# Patient Record
Sex: Male | Born: 1935 | Race: White | Hispanic: No | Marital: Married | Smoking: Never smoker
Health system: Southern US, Community
[De-identification: ages and names within clinical notes are randomized; demographics above are authoritative.]

## PROBLEM LIST (undated history)

## (undated) ENCOUNTER — Emergency Department (HOSPITAL_COMMUNITY): Payer: Medicare Other

## (undated) DIAGNOSIS — I10 Essential (primary) hypertension: Secondary | ICD-10-CM

## (undated) DIAGNOSIS — E785 Hyperlipidemia, unspecified: Secondary | ICD-10-CM

## (undated) HISTORY — DX: Hyperlipidemia, unspecified: E78.5

## (undated) HISTORY — PX: ANKLE SURGERY: SHX546

## (undated) HISTORY — PX: CARPAL TUNNEL RELEASE: SHX101

---

## 2007-07-10 ENCOUNTER — Other Ambulatory Visit: Payer: Self-pay

## 2007-07-10 ENCOUNTER — Emergency Department: Payer: Self-pay | Admitting: Emergency Medicine

## 2007-07-11 ENCOUNTER — Emergency Department: Payer: Self-pay | Admitting: Internal Medicine

## 2007-07-16 ENCOUNTER — Inpatient Hospital Stay: Payer: Self-pay | Admitting: General Surgery

## 2015-12-08 ENCOUNTER — Other Ambulatory Visit: Payer: Self-pay | Admitting: Family Medicine

## 2015-12-08 DIAGNOSIS — M25561 Pain in right knee: Secondary | ICD-10-CM

## 2015-12-21 ENCOUNTER — Ambulatory Visit
Admission: RE | Admit: 2015-12-21 | Discharge: 2015-12-21 | Disposition: A | Discharge status: Home / Self Care | Payer: Medicare Other | Source: Ambulatory Visit | Attending: Family Medicine | Admitting: Family Medicine

## 2015-12-21 ENCOUNTER — Encounter (INDEPENDENT_AMBULATORY_CARE_PROVIDER_SITE_OTHER): Payer: Self-pay

## 2015-12-21 DIAGNOSIS — X58XXXA Exposure to other specified factors, initial encounter: Secondary | ICD-10-CM | POA: Insufficient documentation

## 2015-12-21 DIAGNOSIS — S86811A Strain of other muscle(s) and tendon(s) at lower leg level, right leg, initial encounter: Secondary | ICD-10-CM | POA: Diagnosis not present

## 2015-12-21 DIAGNOSIS — S83521A Sprain of posterior cruciate ligament of right knee, initial encounter: Secondary | ICD-10-CM | POA: Insufficient documentation

## 2015-12-21 DIAGNOSIS — M7121 Synovial cyst of popliteal space [Baker], right knee: Secondary | ICD-10-CM | POA: Insufficient documentation

## 2015-12-21 DIAGNOSIS — M25561 Pain in right knee: Secondary | ICD-10-CM

## 2015-12-21 DIAGNOSIS — M1711 Unilateral primary osteoarthritis, right knee: Secondary | ICD-10-CM | POA: Diagnosis not present

## 2015-12-21 DIAGNOSIS — M25461 Effusion, right knee: Secondary | ICD-10-CM | POA: Insufficient documentation

## 2015-12-21 DIAGNOSIS — S83281A Other tear of lateral meniscus, current injury, right knee, initial encounter: Secondary | ICD-10-CM | POA: Diagnosis not present

## 2015-12-21 DIAGNOSIS — M659 Synovitis and tenosynovitis, unspecified: Secondary | ICD-10-CM | POA: Insufficient documentation

## 2015-12-21 DIAGNOSIS — S83241A Other tear of medial meniscus, current injury, right knee, initial encounter: Secondary | ICD-10-CM | POA: Insufficient documentation

## 2017-01-26 IMAGING — MR MR KNEE*R* W/O CM
6 series · 36 of 40 positions shown · non-contrast
Comparison: Report from radiographs dated 11/23/2015

CLINICAL DATA: Twisting injury of the right knee 5 weeks ago with
swelling and pain medially. Prior knee surgery remotely.

EXAM:
MRI OF THE RIGHT KNEE WITHOUT CONTRAST
TECHNIQUE: Multiplanar, multisequence MR imaging of the knee was performed. No
intravenous contrast was administered.

[Series 3: PD fat-sat · axial · 3.0mm · 0.33mm/px · z∈[-86,+27]mm · 8 of 35 slices shown (1 of 4)]
[im 1/35]
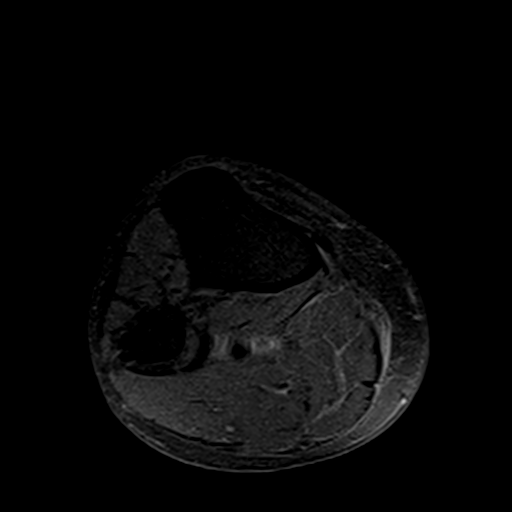
[im 5/35]
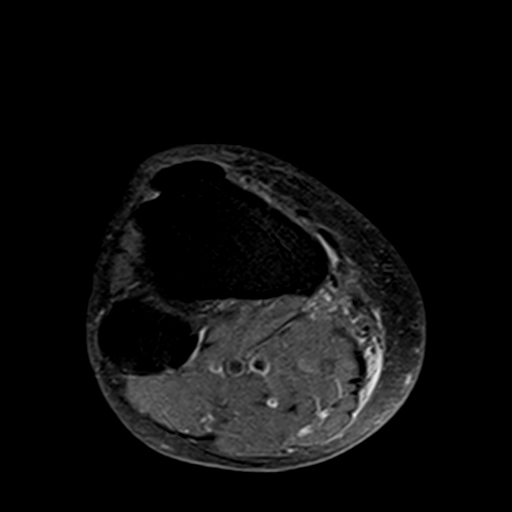
[im 10/35]
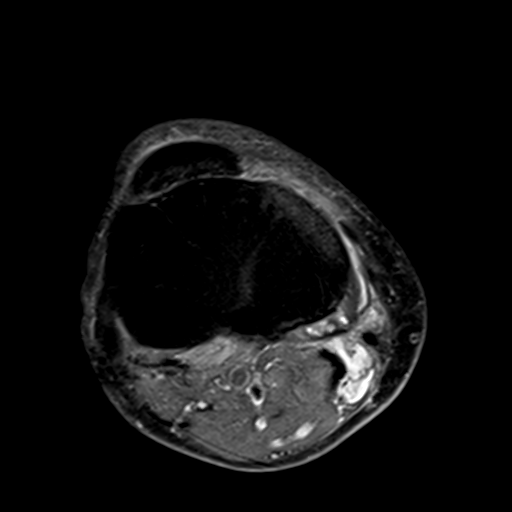
[im 15/35]
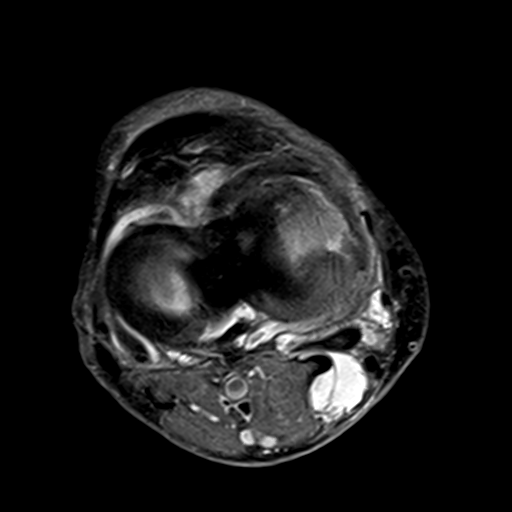
[im 20/35]
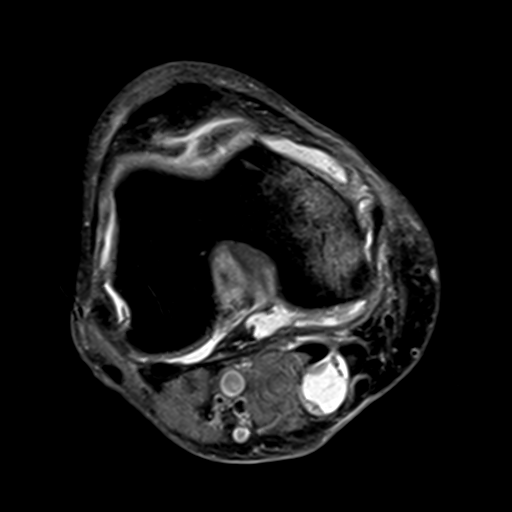
[im 25/35]
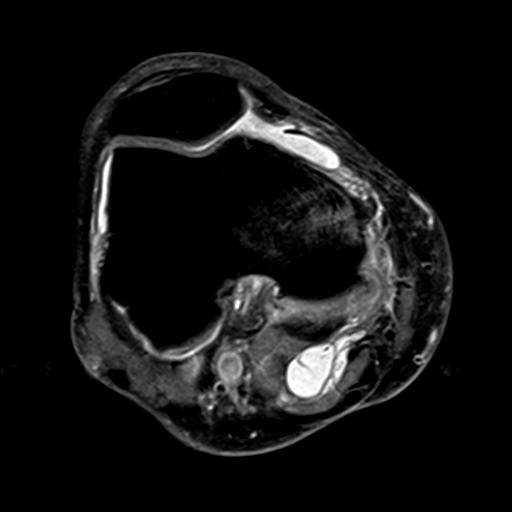
[im 30/35]
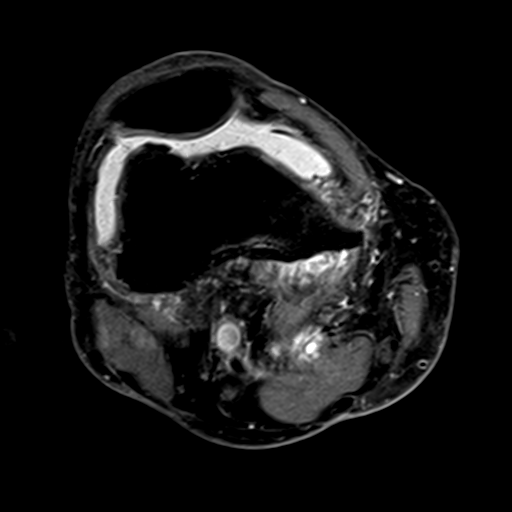
[im 35/35]
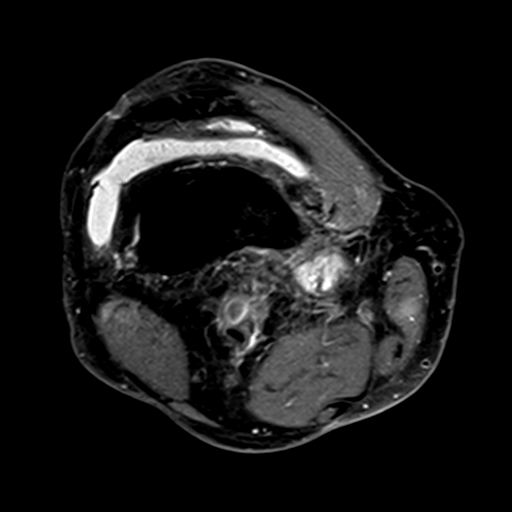

[Series 4: T1 · coronal · 3.0mm · 0.50mm/px · 3 of 33 slices shown]
[im 1/33]
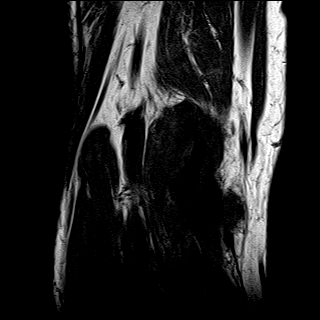
[im 6/33]
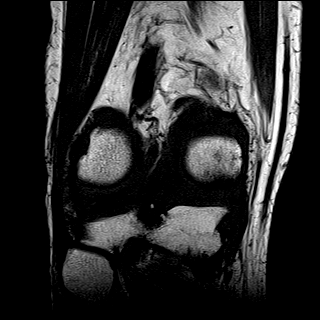
[im 11/33]
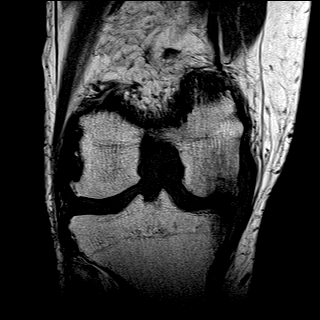

[Series 5: T2 fat-sat · coronal · 3.0mm · 0.50mm/px · 7 of 33 slices shown]
[im 1/33]
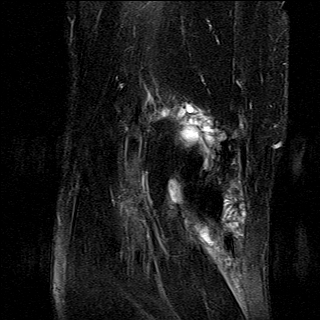
[im 6/33]
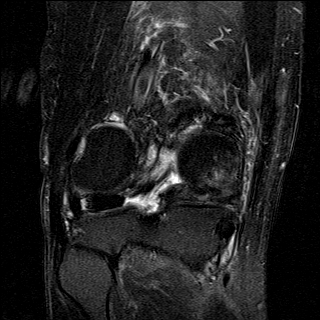
[im 11/33]
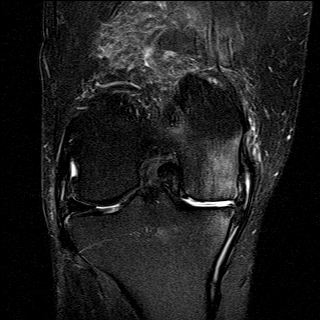
[im 17/33]
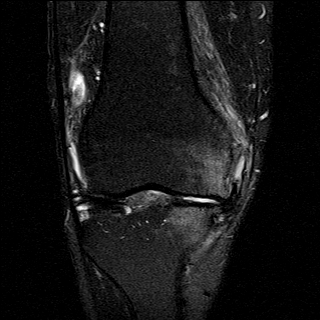
[im 22/33]
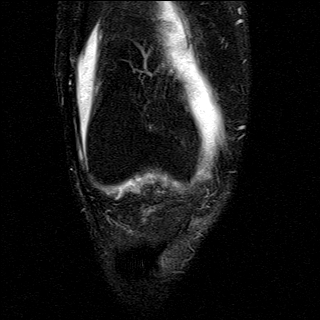
[im 27/33]
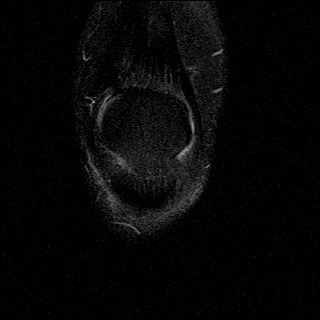
[im 33/33]
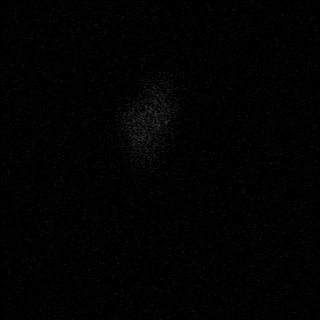

[Series 6: PD fat-sat · coronal · 3.0mm · 0.62mm/px · 7 of 33 slices shown (2 of 4)]
[im 1/33]
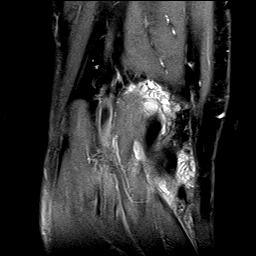
[im 6/33]
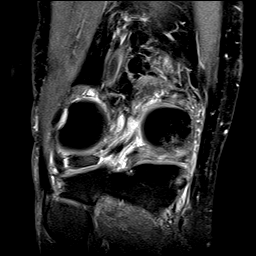
[im 11/33]
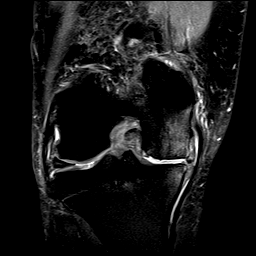
[im 17/33]
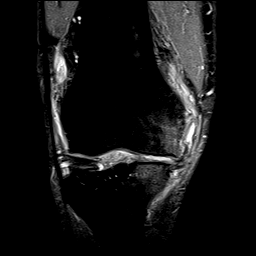
[im 22/33]
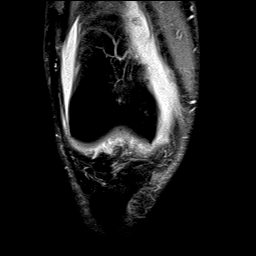
[im 27/33]
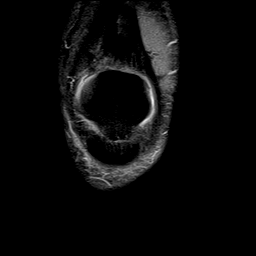
[im 33/33]
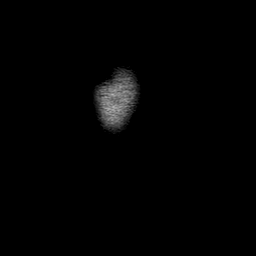

[Series 7: PD fat-sat · sagittal · 3.0mm · 0.62mm/px · 8 of 36 slices shown (3 of 4)]
[im 1/36]
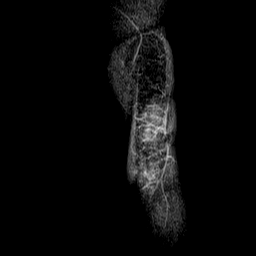
[im 6/36]
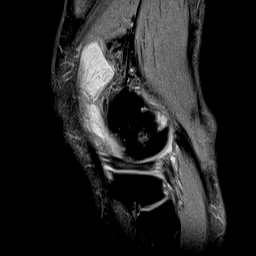
[im 11/36]
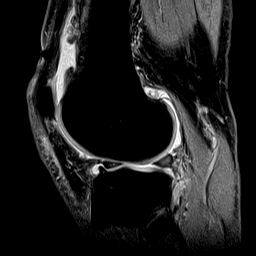
[im 16/36]
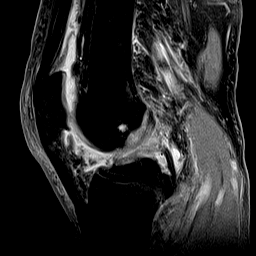
[im 21/36]
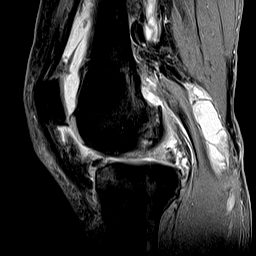
[im 26/36]
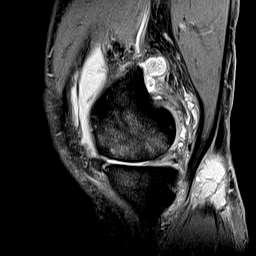
[im 31/36]
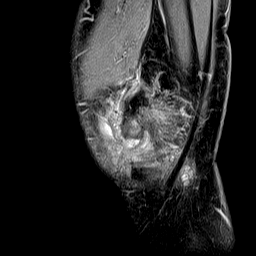
[im 36/36]
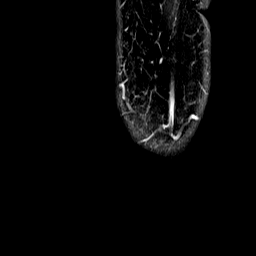

[Series 8: PD fat-sat · oblique · 2.0mm · 0.62mm/px · 3 of 13 slices shown (4 of 4)]
[im 1/13]
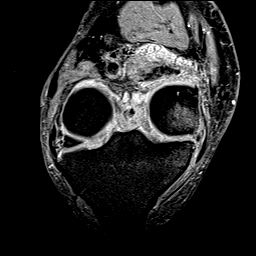
[im 7/13]
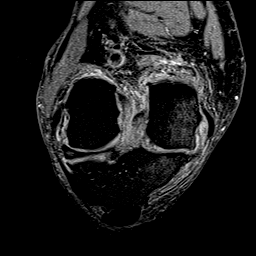
[im 13/13]
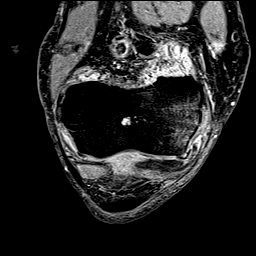

[36 of 40 positions shown; findings below may reference images not displayed]

FINDINGS: MENISCI

Medial meniscus: Severe degenerative tearing of the posterior horn
with horizontal grade 3 signal extending to the free edge and
periphery, prominent abnormal signal throughout the meniscus, and
considerable free edge irregularity. Severely diminutive midbody of
the medial meniscus, possibly from flap tear or partial
meniscectomy. Adjacent tissue in the medial gutter both proximally
and distally could represent meniscal flap.

Lateral meniscus: Large radial tear of the root of the posterior
horn.

LIGAMENTS

Cruciates: Expanded ACL with increased internal signal compatible
with sprain or ACL cyst. I doubt that the ACL is totally torn given
the reasonably normal slope and lack of total fiber discontinuity.
Partially torn PCL.

Collaterals: Partially torn proximal popliteus tendon. Thickened
proximal MCL with surrounding edema suggesting partial tearing.

CARTILAGE

Patellofemoral: Mild to moderate degenerative chondral thinning.
Marginal patellar spurring.

Medial: Markedly severe full-thickness articular cartilage loss in
the majority of the medial compartment with considerable subcortical
marrow edema.

Lateral: Generally mild degenerative chondral thinning with chondral
fissuring and a 5 mm region of chondral delamination in the lateral
tibial plateau as on images 9 through 11 series 5. Marginal
spurring.

Joint: Moderate knee effusion with thickened medial plica and
synovitis. Superior plica noted. Edema infiltrates the posterior
portion of Hoffa's fat pad.

Popliteal Fossa: Approximately 18 cc Baker's cyst with internal
septations. Pes anserine bursitis and semimembranosus -tibial
collateral ligament bursitis.

Extensor Mechanism:  Unremarkable

Bones: No significant extra-articular osseous abnormalities
identified.

Other: No supplemental non-categorized findings.
IMPRESSION: 1. Severe degenerative tearing of the posterior horn medial
meniscus. Markedly diminutive midbody of the medial meniscus,
possibly from partial meniscectomy, but flap tear cannot be
excluded.
2. Large radial tear of the root of the posterior horn lateral
meniscus.
3. ACL sprain versus ACL cyst.
4. Partially torn PCL.
5. Grade 2 sprain of the MCL.
6. Partially torn proximal popliteus tendon.
7. Markedly severe osteoarthritis in the medial compartment with
generally mild to moderate findings in the patellofemoral joint and
lateral compartment. There is a 5 mm region of chondral delamination
along the lateral tibial plateau.
8. Moderate joint effusion with thickened medial plica, superior
plica, and synovitis.
9. 18 cc septated Baker's cyst. Adjacent pes anserine and
semimembranosus-tibial collateral ligament bursitis.

## 2017-06-04 ENCOUNTER — Emergency Department
Admission: EM | Admit: 2017-06-04 | Discharge: 2017-06-04 | Disposition: A | Discharge status: Home / Self Care | Payer: Medicare Other | Attending: Emergency Medicine | Admitting: Emergency Medicine

## 2017-06-04 ENCOUNTER — Other Ambulatory Visit: Payer: Self-pay

## 2017-06-04 ENCOUNTER — Encounter: Payer: Self-pay | Admitting: *Deleted

## 2017-06-04 ENCOUNTER — Emergency Department: Payer: Medicare Other

## 2017-06-04 DIAGNOSIS — R319 Hematuria, unspecified: Secondary | ICD-10-CM | POA: Diagnosis not present

## 2017-06-04 DIAGNOSIS — N390 Urinary tract infection, site not specified: Secondary | ICD-10-CM | POA: Insufficient documentation

## 2017-06-04 DIAGNOSIS — N5089 Other specified disorders of the male genital organs: Secondary | ICD-10-CM

## 2017-06-04 DIAGNOSIS — N452 Orchitis: Secondary | ICD-10-CM | POA: Diagnosis not present

## 2017-06-04 DIAGNOSIS — N50811 Right testicular pain: Secondary | ICD-10-CM | POA: Diagnosis present

## 2017-06-04 LAB — URINALYSIS, COMPLETE (UACMP) WITH MICROSCOPIC
Bilirubin Urine: NEGATIVE
GLUCOSE, UA: NEGATIVE mg/dL
Ketones, ur: NEGATIVE mg/dL
NITRITE: NEGATIVE
PH: 6 (ref 5.0–8.0)
Protein, ur: 30 mg/dL — AB
SPECIFIC GRAVITY, URINE: 1.017 (ref 1.005–1.030)
Squamous Epithelial / LPF: NONE SEEN (ref 0–5)
WBC, UA: >50 WBC/hpf — ABNORMAL HIGH (ref 0–5)

## 2017-06-04 MED ORDER — LEVOFLOXACIN 500 MG PO TABS: 500.0000 mg | ORAL_TABLET | Freq: Every day | ORAL | 0 refills | Status: AC

## 2017-06-04 MED ORDER — LEVOFLOXACIN 500 MG PO TABS
500.0000 mg | ORAL_TABLET | Freq: Once | ORAL | Status: AC
  Administered 2017-06-04: 500 mg via ORAL
  Filled 2017-06-04: qty 1

## 2017-06-04 NOTE — ED Notes (Signed)
E-signature pad unavailable at this time.  Patient verbalized understanding of discharge instructions and medications.

## 2017-06-04 NOTE — ED Notes (Signed)
ED Provider at bedside. 

## 2017-06-04 NOTE — ED Triage Notes (Signed)
Pt reports pain in right testicle that started Monday and has worsened since then. Pt reports testicle has swollen to three times its normal size. Pain increases with movement. Redness noted but pt reports no increased warmth compared to the left testicle. No injury that pt can remember and per pt no cuts, bites, or drainage noted.

## 2017-06-04 NOTE — ED Provider Notes (Signed)
[facility] Emergency Department Provider Note  ___________________________________________   First MD Initiated Contact with Patient 06/04/17 1845     (approximate)  I have reviewed the triage vital signs and the nursing notes.   HISTORY  Chief Complaint Testicle Pain   HPI Cory Woodard is a 84 y.o. male with a history of hyperlipidemia who is presenting with 3 days of right-sided testicular swelling as well as erythema.  He says that he was working outside this past Sunday and felt that he may have strained something but did not have any pain on Sunday.  He said that the swelling that started this past Monday and has worsened.  Denies any burning with urination.  Says that he has the same sexual partner for many years which is his wife and he is not suspicious of STDs.  He has never had similar swelling in the past.  Denies any difficulty with eating or drinking.  No reports of nausea, vomiting or diarrhea.  History reviewed. No pertinent past medical history.  There are no active problems to display for this patient.   History reviewed. No pertinent surgical history.  Prior to Admission medications   Not on File    Allergies Patient has no known allergies.  History reviewed. No pertinent family history.  Social History Social History   Tobacco Use  . Smoking status: Never Smoker  . Smokeless tobacco: Never Used  Substance Use Topics  . Alcohol use: Yes    Frequency: Never    Comment: occasionally  . Drug use: Never    Review of Systems  Constitutional: No fever/chills Eyes: No visual changes. ENT: No sore throat. Cardiovascular: Denies chest pain. Respiratory: Denies shortness of breath. Gastrointestinal: No abdominal pain.  No nausea, no vomiting.  No diarrhea.  No constipation. Genitourinary: Negative for dysuria. Musculoskeletal: Negative for back pain. Skin: Negative for rash. Neurological: Negative for headaches, focal  weakness or numbness.   ____________________________________________   PHYSICAL EXAM:  VITAL SIGNS: ED Triage Vitals  Enc Vitals Group     BP 06/04/17 1549 (!) 149/102     Pulse Rate 06/04/17 1549 63     Resp 06/04/17 1549 16     Temp 06/04/17 1549 98.5 F (36.9 C)     Temp Source 06/04/17 1549 Oral     SpO2 06/04/17 1549 99 %     Weight 06/04/17 1550 160 lb (72.6 kg)     Height 06/04/17 1550  (1.702 m)     Head Circumference --      Peak Flow --      Pain Score 06/04/17 1550 8     Pain Loc --      Pain Edu? --      Excl. in GC? --     Constitutional: Alert and oriented. Well appearing and in no acute distress. Eyes: Conjunctivae are normal.  Head: Atraumatic. Nose: No congestion/rhinnorhea. Mouth/Throat: Mucous membranes are moist.  Neck: No stridor.   Cardiovascular: Normal rate, regular rhythm. Grossly normal heart sounds.   Respiratory: Normal respiratory effort.  No retractions. Lungs CTAB. Gastrointestinal: Soft and nontender. No distention.  Genitourinary: Right testicular swelling with erythema.  No induration.  Testicle feels firm but not rock hard.  There is mild to moderate tenderness to palpation over the testicle as well as the epididymis.  Patient is uncircumcised without any swelling or tenderness to palpation to the penis or surrounding area of groin.  Musculoskeletal: No lower extremity tenderness nor  edema.  No joint effusions. Neurologic:  Normal speech and language. No gross focal neurologic deficits are appreciated. Skin:  Skin is warm, dry and intact. No rash noted. Psychiatric: Mood and affect are normal. Speech and behavior are normal.  ____________________________________________   LABS (all labs ordered are listed, but only abnormal results are displayed)  Labs Reviewed  URINALYSIS, COMPLETE (UACMP) WITH MICROSCOPIC - Abnormal; Notable for the following components:      Result Value   Color, Urine YELLOW (*)    APPearance CLOUDY (*)      Hgb urine dipstick MODERATE (*)    Protein, ur 30 (*)    Leukocytes, UA LARGE (*)    WBC, UA >50 (*)    Bacteria, UA MANY (*)    All other components within normal limits  URINE CULTURE   ____________________________________________  EKG   ____________________________________________  RADIOLOGY  Ultrasound with bilateral complex hydroceles. ____________________________________________   PROCEDURES  Procedure(s) performed:   Procedures  Critical Care performed:   ____________________________________________   INITIAL IMPRESSION / ASSESSMENT AND PLAN / ED COURSE  Pertinent labs & imaging results that were available during my care of the patient were reviewed by me and considered in my medical decision making (see chart for details).  DDX: UTI, epididymitis, orchitis, hernia, hydrocele As part of my medical decision making, I reviewed the following data within the electronic MEDICAL RECORD NUMBER outpt records reviewed.    ----------------------------------------- 7:21 PM on 06/04/2017 -----------------------------------------  Discussed case with Dr. Apolinar Junes of urology who reviewed the images.  She agrees with 10 days of Levaquin.  Patient will be discharged at this time for urology follow-up.  He is understanding of the plan and willing to comply. ____________________________________________   FINAL CLINICAL IMPRESSION(S) / ED DIAGNOSES  UTI.  Orchitis.    NEW MEDICATIONS STARTED DURING THIS VISIT:  New Prescriptions   No medications on file     Note:  This document was prepared using Dragon voice recognition software and may include unintentional dictation errors.     Myrna Blazer, MD 06/04/17 Norberta Keens

## 2017-06-07 LAB — URINE CULTURE

## 2017-07-03 ENCOUNTER — Ambulatory Visit: Payer: Medicare Other | Admitting: Urology

## 2017-07-03 ENCOUNTER — Encounter: Payer: Self-pay | Admitting: Urology

## 2017-07-03 VITALS — BP 180/67 | HR 66 | Ht 67.0 in | Wt 157.9 lb

## 2017-07-03 DIAGNOSIS — R350 Frequency of micturition: Secondary | ICD-10-CM

## 2017-07-03 DIAGNOSIS — N453 Epididymo-orchitis: Secondary | ICD-10-CM

## 2017-07-03 LAB — URINALYSIS, COMPLETE
Bilirubin, UA: NEGATIVE
GLUCOSE, UA: NEGATIVE
KETONES UA: NEGATIVE
LEUKOCYTES UA: NEGATIVE
NITRITE UA: NEGATIVE
PROTEIN UA: NEGATIVE
SPEC GRAV UA: 1.025 (ref 1.005–1.030)
Urobilinogen, Ur: 0.2 mg/dL (ref 0.2–1.0)
pH, UA: 5 (ref 5.0–7.5)

## 2017-07-03 NOTE — Progress Notes (Signed)
07/03/2017 3:52 PM   Dennie Bible 1933-01-27 [id number]  Referring provider: Raynelle Bring [address] [place], [place] [address]  Chief Complaint  Patient presents with  . Hospitalization Follow-up    HPI: Cory Woodard is an 84 year old male who presents for follow-up of a recent ED visit for UTI.  He initially presented to the [facility] acute care on 06/04/2017 complaining of right scrotal pain and swelling and was transferred to the [facility] emergency department.  He had progressive discomfort and swelling of his right hemiscrotum.  He denied bothersome lower urinary tract symptoms including frequency, urgency or dysuria.  He denied fever, chills, nausea or vomiting.  His exam was remarkable for right hemiscrotal erythema and swelling.  The right testis was noted to be firm with tenderness of the testis and epididymis.  A scrotal ultrasound was performed which showed mild enlargement of the right testis and heterogeneous right epididymis.  Urinalysis showed significant pyuria and a urine culture subsequently grew pansensitive E. Coli.  He was treated with a 10-day course of Levaquin with resolution of his pain and swelling.  He has no complaints today.  He denies previous history of UTI or epididymitis.   PMH: Past Medical History:  Diagnosis Date  . Hyperlipidemia     Surgical History: Past Surgical History:  Procedure Laterality Date  . ANKLE SURGERY    . CARPAL TUNNEL RELEASE      Home Medications:  Allergies as of 07/03/2017   No Known Allergies     Medication List        Accurate as of 07/03/17  3:52 PM. Always use your most recent med list.          aspirin EC 81 MG tablet Take by mouth.   fluticasone 50 MCG/ACT nasal spray Commonly known as:  FLONASE Place into the nose.   meloxicam 15 MG tablet Commonly known as:  MOBIC Take by mouth.   Omega-3 1000 MG Caps Take by mouth.   pravastatin 40 MG tablet Commonly known as:   PRAVACHOL Take by mouth.   tamsulosin 0.4 MG Caps capsule Commonly known as:  FLOMAX       Allergies: No Known Allergies  Family History: History reviewed. No pertinent family history.  Social History:  reports that he has never smoked. He has never used smokeless tobacco. He reports that he drinks alcohol. He reports that he does not use drugs.  ROS: UROLOGY Frequent Urination?: Yes Hard to postpone urination?: No Burning/pain with urination?: No Get up at night to urinate?: Yes Leakage of urine?: No Urine stream starts and stops?: Yes Trouble starting stream?: No Do you have to strain to urinate?: No Blood in urine?: No Urinary tract infection?: Yes Sexually transmitted disease?: No Injury to kidneys or bladder?: No Painful intercourse?: No Weak stream?: No Erection problems?: No Penile pain?: No  Gastrointestinal Nausea?: No Vomiting?: No Indigestion/heartburn?: No Diarrhea?: No Constipation?: No  Constitutional Fever: No Night sweats?: No Weight loss?: No Fatigue?: No  Skin Skin rash/lesions?: No Itching?: No  Eyes Blurred vision?: No Double vision?: No  Ears/Nose/Throat Sore throat?: Yes Sinus problems?: Yes  Hematologic/Lymphatic Swollen glands?: No Easy bruising?: No  Cardiovascular Leg swelling?: No Chest pain?: No  Respiratory Cough?: Yes Shortness of breath?: No  Endocrine Excessive thirst?: No  Musculoskeletal Back pain?: No Joint pain?: Yes  Neurological Headaches?: No Dizziness?: No  Psychologic Depression?: No Anxiety?: No  Physical Exam: BP (!) 180/67 (BP Location: Right Arm, Patient Position: Sitting,  Cuff Size: Large)   Pulse 66   Ht  (1.702 m)   Wt 157 lb 14.4 oz (71.6 kg)   SpO2 99%   BMI 24.73 kg/m   Constitutional:  Alert and oriented, No acute distress. HEENT: [place] AT, moist mucus membranes.  Trachea midline, no masses. Cardiovascular: No clubbing, cyanosis, or edema. Respiratory: Normal  respiratory effort, no increased work of breathing. GI: Abdomen is soft, nontender, nondistended, no abdominal masses GU: No CVA tenderness.  Penis without lesions.  No scrotal erythema or swelling noted.  Left testis descended and palpably normal.  Right testis is of normal size with slight firmness.  No tenderness present. Lymph: No cervical or inguinal lymphadenopathy. Skin: No rashes, bruises or suspicious lesions. Neurologic: Grossly intact, no focal deficits, moving all 4 extremities. Psychiatric: Normal mood and affect.  Laboratory Data:  Urinalysis Dipstick trace blood/leukocyte negative; microscopy negative   Assessment & Plan:   84 year old male with resolved epididymoorchitis.  Urinalysis today is clear.  He was instructed to call should he experience recurrent scrotal pain/swelling or worsening voiding symptoms.   Return if symptoms worsen or fail to improve.  Riki Altes, MD  [facility] [address] [place], [place] [address] [phone]

## 2018-07-11 IMAGING — US US SCROTUM W/ DOPPLER COMPLETE
1 series · 14 of 25 positions shown · non-contrast
Comparison: None.

CLINICAL DATA: RIGHT testicle pain and swelling for 3 days, scrotal
swelling.

EXAM:
SCROTAL ULTRASOUND
DOPPLER ULTRASOUND OF THE TESTICLES
TECHNIQUE: Complete ultrasound examination of the testicles, epididymis, and
other scrotal structures was performed. Color and spectral Doppler
ultrasound were also utilized to evaluate blood flow to the
testicles.

[Series 1: us scrotum w/ doppler complete · 14 of 65 slices shown]
[im 1/65]
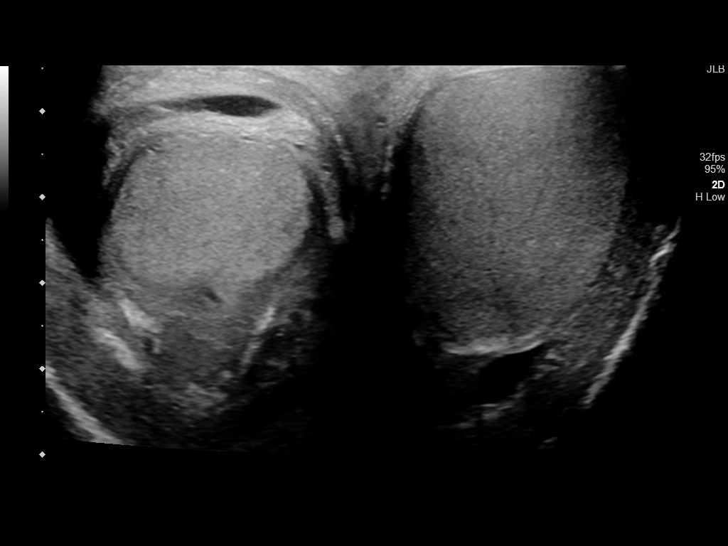
[im 6/65]
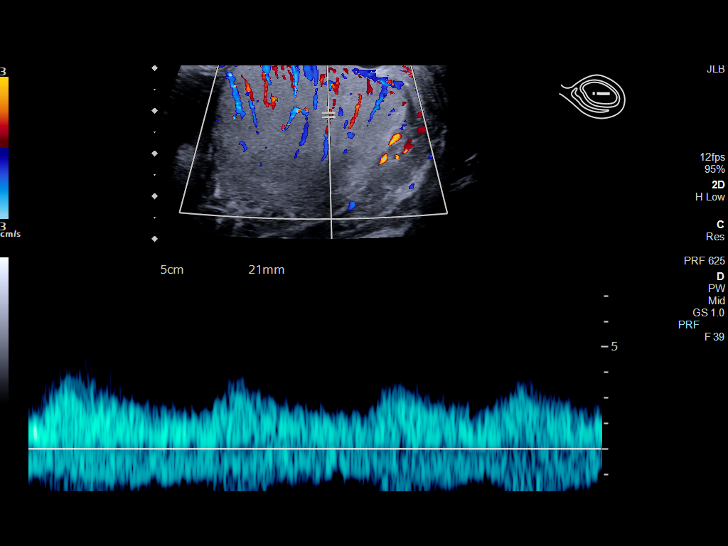
[im 11/65]
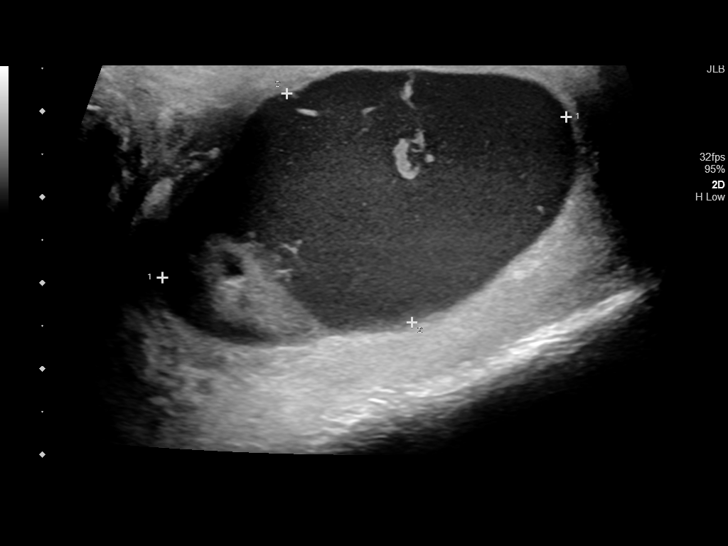
[im 17/65]
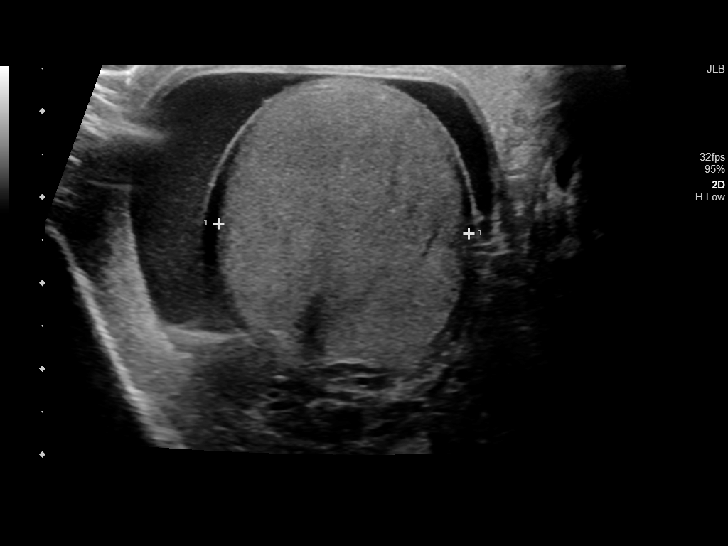
[im 22/65]
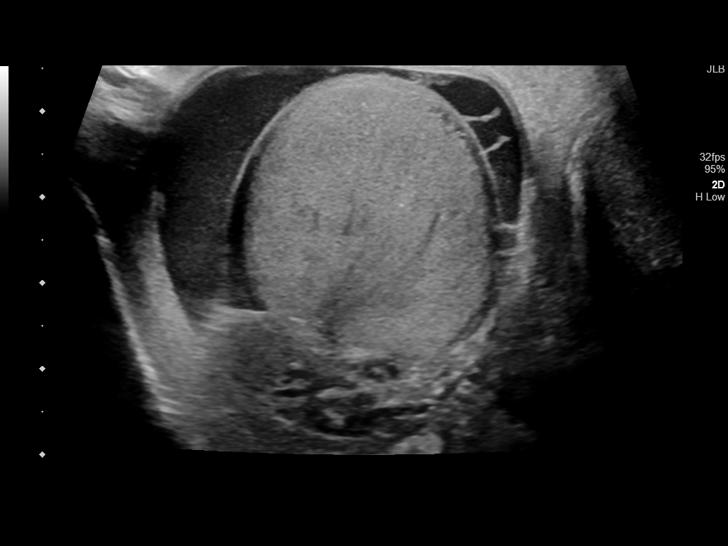
[im 25/65]
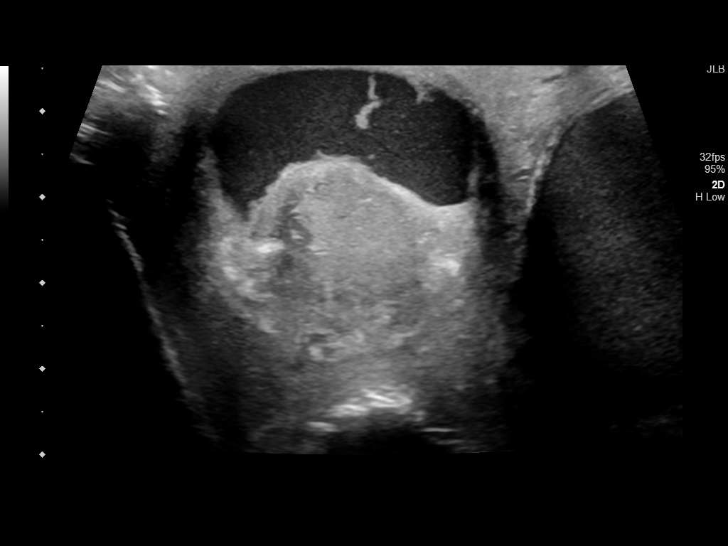
[im 30/65]
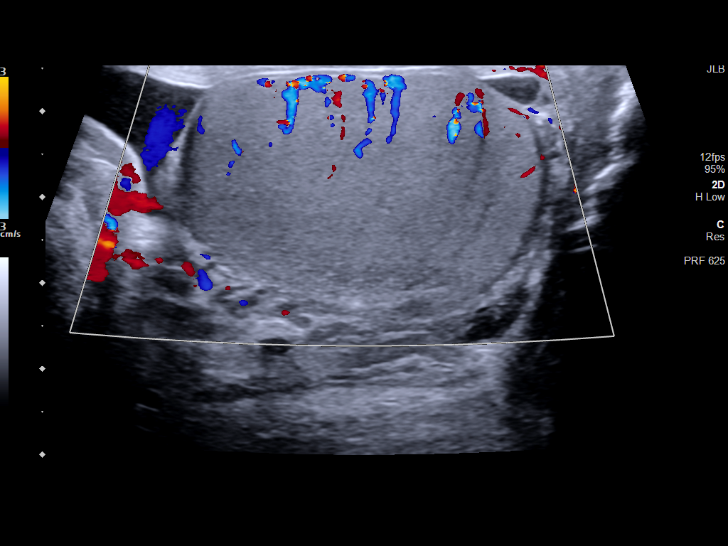
[im 35/65]
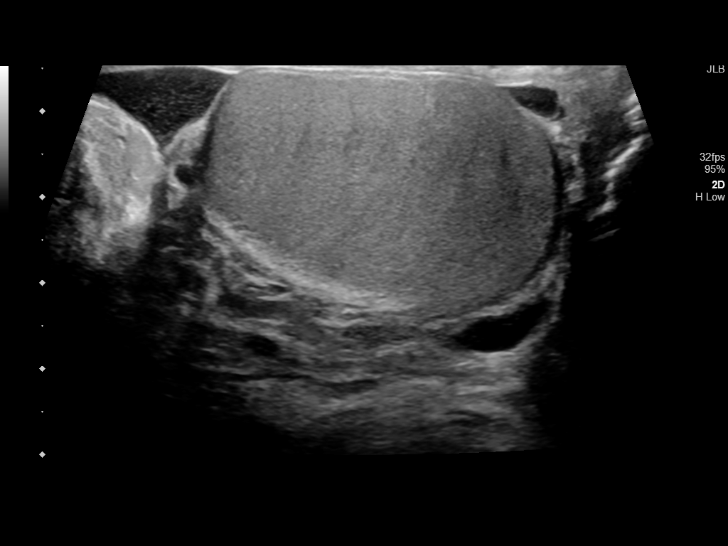
[im 41/65]
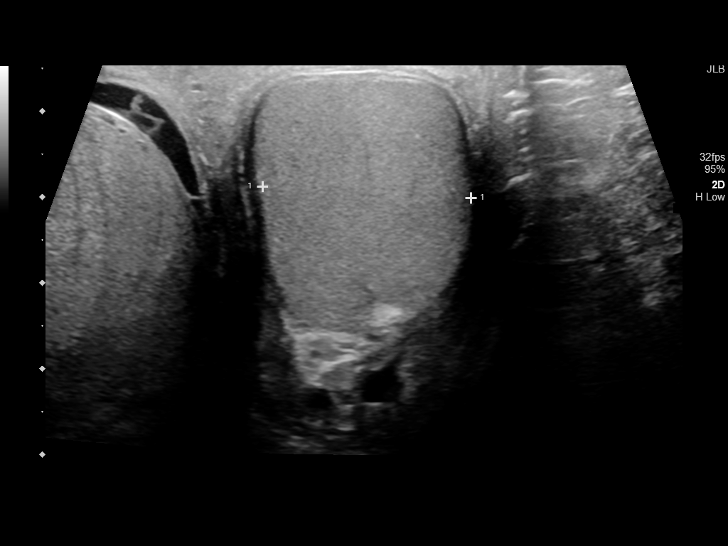
[im 43/65]
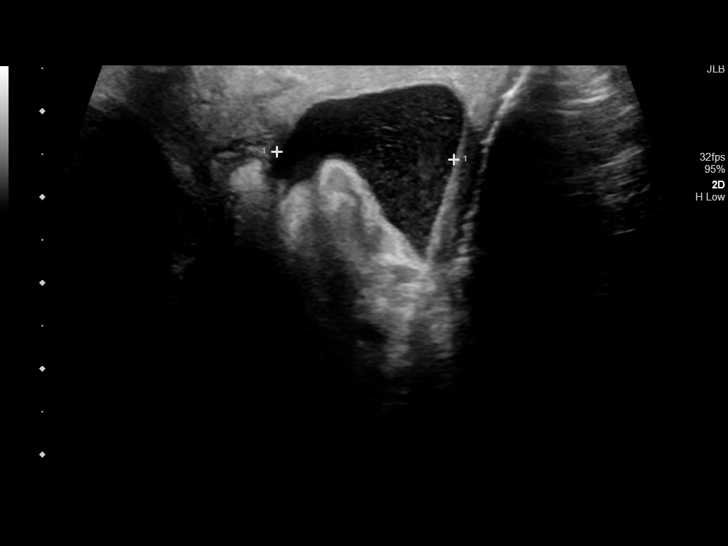
[im 49/65]
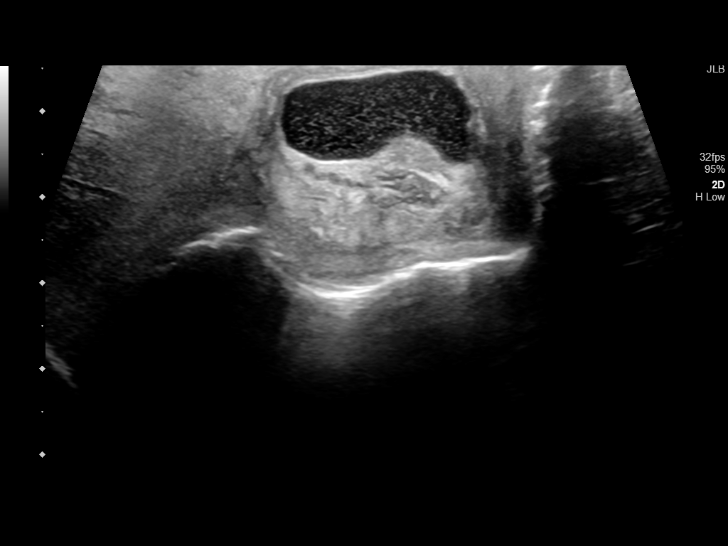
[im 54/65]
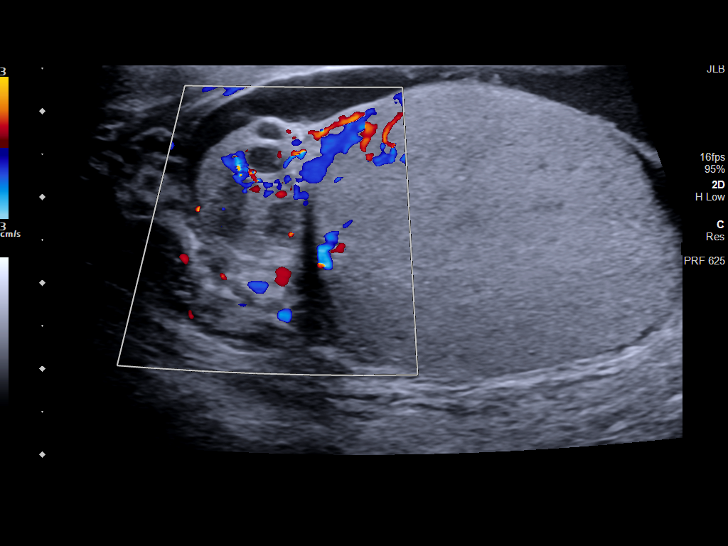
[im 59/65]
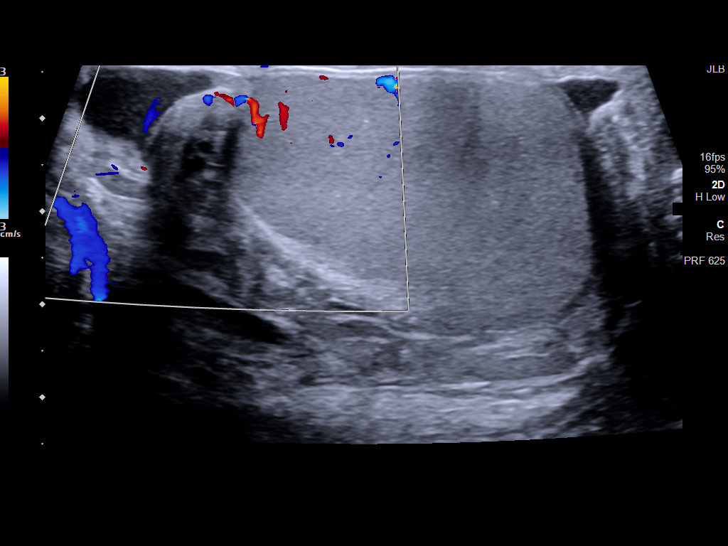
[im 65/65]
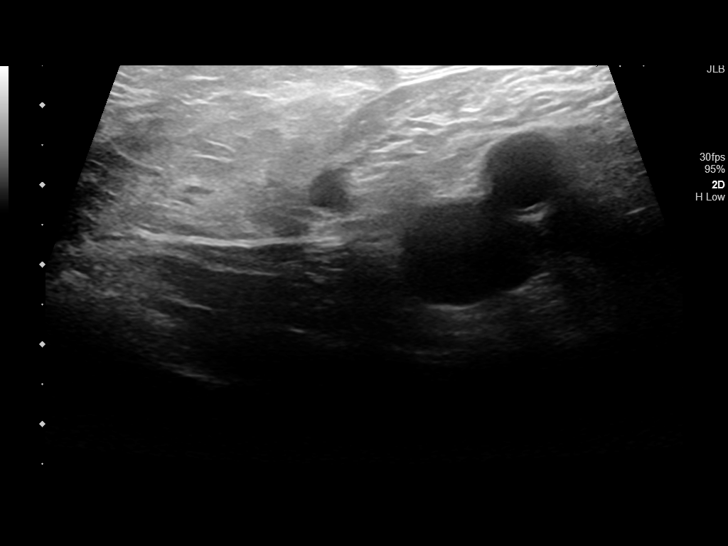

[14 of 25 positions shown; findings below may reference images not displayed]

FINDINGS: Right testicle

Measurements: 4.6 x 3.7 x 2.9 cm. No mass or microlithiasis
visualized.

Left testicle

Measurements: 3.9 x 2.8 x 2.4 cm. No mass or microlithiasis
visualized.

Right epididymis: Heterogeneous in appearance but, not
hypervascular.

Left epididymis:  Normal in size and appearance.

Hydrocele:  Moderate complex bilateral hydroceles.

Varicocele:  None visualized.

Pulsed Doppler interrogation of both testes demonstrates normal low
resistance arterial and venous waveforms bilaterally.
IMPRESSION: 1. Moderate bilateral complex hydroceles.
2. Heterogeneous epididymi without acute epididymitis orchitis.

## 2023-12-18 ENCOUNTER — Emergency Department
Admission: EM | Admit: 2023-12-18 | Discharge: 2023-12-18 | Disposition: A | Discharge status: Home / Self Care | Attending: Emergency Medicine | Admitting: Emergency Medicine

## 2023-12-18 ENCOUNTER — Other Ambulatory Visit: Payer: Self-pay

## 2023-12-18 ENCOUNTER — Emergency Department

## 2023-12-18 DIAGNOSIS — I10 Essential (primary) hypertension: Secondary | ICD-10-CM | POA: Diagnosis not present

## 2023-12-18 DIAGNOSIS — S0181XA Laceration without foreign body of other part of head, initial encounter: Secondary | ICD-10-CM | POA: Insufficient documentation

## 2023-12-18 DIAGNOSIS — W19XXXA Unspecified fall, initial encounter: Secondary | ICD-10-CM

## 2023-12-18 DIAGNOSIS — W01198A Fall on same level from slipping, tripping and stumbling with subsequent striking against other object, initial encounter: Secondary | ICD-10-CM | POA: Diagnosis not present

## 2023-12-18 HISTORY — DX: Essential (primary) hypertension: I10

## 2023-12-18 MED ORDER — LIDOCAINE HCL (PF) 1 % IJ SOLN
5.0000 mL | Freq: Once | INTRAMUSCULAR | Status: AC
  Administered 2023-12-18: 5 mL via INTRADERMAL
  Filled 2023-12-18: qty 5

## 2023-12-18 NOTE — ED Provider Notes (Signed)
 [facility] Provider Note    Event Date/Time   First MD Initiated Contact with Patient 12/18/23 2110     (approximate)   History   Fall   HPI  Cory Woodard is a 89 y.o. male with PMH of hyperlipidemia and hypertension who presents for evaluation after fall.  Patient's tripped while walking up the stairs and hit his left forehead on the stairs as well as scraped his left arm.  Did not feel dizzy before the fall.      Physical Exam   Triage Vital Signs: ED Triage Vitals  Encounter Vitals Group     BP 12/18/23 2030 (!) 177/92     Girls Systolic BP Percentile --      Girls Diastolic BP Percentile --      Boys Systolic BP Percentile --      Boys Diastolic BP Percentile --      Pulse Rate 12/18/23 2030 (!) 102     Resp 12/18/23 2030 16     Temp 12/18/23 2030 98.2 F (36.8 C)     Temp Source 12/18/23 2030 Oral     SpO2 12/18/23 2030 100 %     Weight --      Height --      Head Circumference --      Peak Flow --      Pain Score 12/18/23 2031 5     Pain Loc --      Pain Education --      Exclude from Growth Chart --     Most recent vital signs: Vitals:   12/18/23 2030  BP: (!) 177/92  Pulse: (!) 102  Resp: 16  Temp: 98.2 F (36.8 C)  SpO2: 100%   General: Awake, no distress.  CV:  Good peripheral perfusion.  RRR Resp:  Normal effort.  CTAB. Abd:  No distention.  Other:  Laceration to the left forehead and large skin tear to the left forearm, no focal neurodeficits   ED Results / Procedures / Treatments   Labs (all labs ordered are listed, but only abnormal results are displayed) Labs Reviewed - No data to display   RADIOLOGY  CT head and CT cervical spine obtained, I interpreted the images as well as reviewed the radiologist report which was negative for acute abnormalities.  PROCEDURES:  Critical Care performed: No  .Laceration Repair  Date/Time: 12/18/2023 11:38 PM  Performed by: Cleaster Tinnie LABOR, PA-C Authorized  by: Cleaster Tinnie LABOR, PA-C   Consent:    Consent obtained:  Verbal   Consent given by:  Patient   Risks, benefits, and alternatives were discussed: yes     Risks discussed:  Infection, pain and poor cosmetic result   Alternatives discussed:  No treatment Universal protocol:    Patient identity confirmed:  Verbally with patient Anesthesia:    Anesthesia method:  Local infiltration   Local anesthetic:  Lidocaine 1% w/o epi Laceration details:    Location:  Face   Face location:  Forehead   Length (cm):  2   Depth (mm):  7 Exploration:    Hemostasis achieved with:  Direct pressure   Wound exploration: entire depth of wound visualized   Treatment:    Area cleansed with:  Povidone-iodine   Amount of cleaning:  Standard   Irrigation solution:  Sterile saline   Irrigation method:  Syringe Skin repair:    Repair method:  Sutures   Suture size:  6-0   Suture material:  Prolene   Suture technique:  Simple interrupted   Number of sutures:  6 Approximation:    Approximation:  Close Repair type:    Repair type:  Simple Post-procedure details:    Dressing:  Non-adherent dressing and bulky dressing   Procedure completion:  Tolerated well, no immediate complications    MEDICATIONS ORDERED IN ED: Medications  lidocaine (PF) (XYLOCAINE) 1 % injection 5 mL (5 mLs Intradermal Given 12/18/23 2235)     IMPRESSION / MDM / ASSESSMENT AND PLAN / ED COURSE  I reviewed the triage vital signs and the nursing notes.                             89 year old male presents for evaluation after mechanical fall.  Patient was tachycardic and hypertensive on initial assessment.  Vital signs stable otherwise and patient NAD on exam.  Differential diagnosis includes, but is not limited to, laceration, abrasion, skin tear, contusion, skull fracture, intracranial bleed, cervical spine fracture.  Patient's presentation is most consistent with acute complicated illness / injury requiring diagnostic  workup.  CT head and CT cervical spine were negative for acute abnormalities.  Physical exam is overall reassuring, patient has minimal pain and no focal neurodeficits.  Patient's skin tear to the left arm was cleaned and dressed by the nursing staff.  The laceration on patient's forehead was repaired using sutures, please see procedure note above for further details.  Patient and his wife were instructed on wound care.  Patient did not want to update his tetanus shot.  Patient voiced understanding, all questions were answered and he was stable at discharge.    FINAL CLINICAL IMPRESSION(S) / ED DIAGNOSES   Final diagnoses:  Fall, initial encounter  Laceration of forehead, initial encounter     Rx / DC Orders   ED Discharge Orders     None        Note:  This document was prepared using Dragon voice recognition software and may include unintentional dictation errors.   Cleaster Tinnie LABOR, PA-C 12/18/23 2339    Dorothyann Drivers, MD 12/22/23 2225

## 2023-12-18 NOTE — Discharge Instructions (Addendum)
 The CT scan of your head and neck did not show any acute abnormalities.  The laceration on your forehead was repaired using nonabsorbable sutures.  They will need to be removed in 5 days.  This can be done by primary care, urgent care or the emergency department.  Please wash the wounds with soap and water daily and pat dry.  You can then apply nonadherent gauze and cover with more gauze or a Band-Aid. Watch for signs of infection including redness, warmth, swelling, pain and pus drainage.  If you develop any of these please return to the ED, urgent care or your primary care provider.  You can have Tylenol or ibuprofen as needed for pain.  You may have some soreness tomorrow after your fall.  Please return to the emergency department with any worsening symptoms.

## 2023-12-18 NOTE — ED Triage Notes (Signed)
 POV from home after the patient tripped while walking up the stairs outside. Patient reports hitting his head on the brick steps. Denies any LOC. Not on blood thinners. Laceration noted to the left forehead. Ambulatory to triage.

## 2023-12-19 NOTE — ED Notes (Signed)
 Pt dc'd by PA. No vital signs obtained.
